# Patient Record
Sex: Female | Born: 1945 | Race: White | Hispanic: No | Marital: Married | Smoking: Never smoker
Health system: Southern US, Community
[De-identification: ages and names within clinical notes are randomized; demographics above are authoritative.]

## PROBLEM LIST (undated history)

## (undated) DIAGNOSIS — E039 Hypothyroidism, unspecified: Secondary | ICD-10-CM

## (undated) HISTORY — PX: TUBAL LIGATION: SHX77

## (undated) HISTORY — PX: FINGER SURGERY: SHX640

---

## 2010-03-01 ENCOUNTER — Ambulatory Visit: Payer: Self-pay | Admitting: Family Medicine

## 2010-07-24 ENCOUNTER — Ambulatory Visit: Payer: Self-pay | Admitting: Family Medicine

## 2011-02-21 ENCOUNTER — Emergency Department: Payer: Self-pay | Admitting: Unknown Physician Specialty

## 2011-02-21 LAB — URINALYSIS, COMPLETE
Bilirubin,UR: NEGATIVE
Blood: NEGATIVE
Glucose,UR: NEGATIVE mg/dL (ref 0–75)
Ketone: NEGATIVE
Leukocyte Esterase: NEGATIVE
Nitrite: NEGATIVE
Ph: 5 (ref 4.5–8.0)
Protein: NEGATIVE
RBC,UR: <1 /HPF (ref 0–5)
Specific Gravity: 1.025 (ref 1.003–1.030)
Squamous Epithelial: 8
WBC UR: <1 /HPF (ref 0–5)

## 2011-06-12 ENCOUNTER — Ambulatory Visit: Payer: Self-pay | Admitting: Internal Medicine

## 2012-06-23 ENCOUNTER — Ambulatory Visit: Payer: Self-pay | Admitting: Internal Medicine

## 2013-07-05 ENCOUNTER — Ambulatory Visit: Payer: Self-pay | Admitting: Internal Medicine

## 2015-02-06 DIAGNOSIS — H40003 Preglaucoma, unspecified, bilateral: Secondary | ICD-10-CM | POA: Diagnosis not present

## 2015-06-08 DIAGNOSIS — E7800 Pure hypercholesterolemia, unspecified: Secondary | ICD-10-CM | POA: Diagnosis not present

## 2015-06-08 DIAGNOSIS — E042 Nontoxic multinodular goiter: Secondary | ICD-10-CM | POA: Diagnosis not present

## 2015-06-15 DIAGNOSIS — Z1231 Encounter for screening mammogram for malignant neoplasm of breast: Secondary | ICD-10-CM | POA: Diagnosis not present

## 2015-06-15 DIAGNOSIS — R232 Flushing: Secondary | ICD-10-CM | POA: Diagnosis not present

## 2015-06-15 DIAGNOSIS — Z0001 Encounter for general adult medical examination with abnormal findings: Secondary | ICD-10-CM | POA: Diagnosis not present

## 2015-06-15 DIAGNOSIS — E042 Nontoxic multinodular goiter: Secondary | ICD-10-CM | POA: Diagnosis not present

## 2015-06-15 DIAGNOSIS — E7800 Pure hypercholesterolemia, unspecified: Secondary | ICD-10-CM | POA: Diagnosis not present

## 2015-06-18 ENCOUNTER — Other Ambulatory Visit: Payer: Self-pay | Admitting: Internal Medicine

## 2015-06-18 DIAGNOSIS — Z1231 Encounter for screening mammogram for malignant neoplasm of breast: Secondary | ICD-10-CM

## 2015-06-26 ENCOUNTER — Other Ambulatory Visit: Payer: Self-pay | Admitting: Internal Medicine

## 2015-06-26 ENCOUNTER — Ambulatory Visit
Admission: RE | Admit: 2015-06-26 | Discharge: 2015-06-26 | Disposition: A | Discharge status: Home / Self Care | Payer: PPO | Source: Ambulatory Visit | Attending: Internal Medicine | Admitting: Internal Medicine

## 2015-06-26 DIAGNOSIS — Z1231 Encounter for screening mammogram for malignant neoplasm of breast: Secondary | ICD-10-CM

## 2015-12-11 DIAGNOSIS — E042 Nontoxic multinodular goiter: Secondary | ICD-10-CM | POA: Diagnosis not present

## 2015-12-18 DIAGNOSIS — Z0000 Encounter for general adult medical examination without abnormal findings: Secondary | ICD-10-CM | POA: Diagnosis not present

## 2015-12-18 DIAGNOSIS — R232 Flushing: Secondary | ICD-10-CM | POA: Diagnosis not present

## 2015-12-18 DIAGNOSIS — E042 Nontoxic multinodular goiter: Secondary | ICD-10-CM | POA: Diagnosis not present

## 2015-12-18 DIAGNOSIS — E7800 Pure hypercholesterolemia, unspecified: Secondary | ICD-10-CM | POA: Diagnosis not present

## 2016-05-29 ENCOUNTER — Other Ambulatory Visit: Payer: Self-pay | Admitting: Internal Medicine

## 2016-05-29 DIAGNOSIS — Z1231 Encounter for screening mammogram for malignant neoplasm of breast: Secondary | ICD-10-CM

## 2016-06-10 DIAGNOSIS — E7800 Pure hypercholesterolemia, unspecified: Secondary | ICD-10-CM | POA: Diagnosis not present

## 2016-06-10 DIAGNOSIS — E042 Nontoxic multinodular goiter: Secondary | ICD-10-CM | POA: Diagnosis not present

## 2016-06-16 DIAGNOSIS — Z0001 Encounter for general adult medical examination with abnormal findings: Secondary | ICD-10-CM | POA: Diagnosis not present

## 2016-06-16 DIAGNOSIS — E7800 Pure hypercholesterolemia, unspecified: Secondary | ICD-10-CM | POA: Diagnosis not present

## 2016-06-16 DIAGNOSIS — Z1211 Encounter for screening for malignant neoplasm of colon: Secondary | ICD-10-CM | POA: Diagnosis not present

## 2016-06-16 DIAGNOSIS — E042 Nontoxic multinodular goiter: Secondary | ICD-10-CM | POA: Diagnosis not present

## 2016-06-19 DIAGNOSIS — H2513 Age-related nuclear cataract, bilateral: Secondary | ICD-10-CM | POA: Diagnosis not present

## 2016-06-24 DIAGNOSIS — Z1211 Encounter for screening for malignant neoplasm of colon: Secondary | ICD-10-CM | POA: Diagnosis not present

## 2016-06-26 ENCOUNTER — Ambulatory Visit
Admission: RE | Admit: 2016-06-26 | Discharge: 2016-06-26 | Disposition: A | Discharge status: Home / Self Care | Payer: PPO | Source: Ambulatory Visit | Attending: Internal Medicine | Admitting: Internal Medicine

## 2016-06-26 DIAGNOSIS — Z1231 Encounter for screening mammogram for malignant neoplasm of breast: Secondary | ICD-10-CM

## 2016-08-22 DIAGNOSIS — H2513 Age-related nuclear cataract, bilateral: Secondary | ICD-10-CM | POA: Diagnosis not present

## 2016-12-10 DIAGNOSIS — E042 Nontoxic multinodular goiter: Secondary | ICD-10-CM | POA: Diagnosis not present

## 2016-12-17 DIAGNOSIS — Z5181 Encounter for therapeutic drug level monitoring: Secondary | ICD-10-CM | POA: Diagnosis not present

## 2016-12-17 DIAGNOSIS — E042 Nontoxic multinodular goiter: Secondary | ICD-10-CM | POA: Diagnosis not present

## 2016-12-17 DIAGNOSIS — R232 Flushing: Secondary | ICD-10-CM | POA: Diagnosis not present

## 2016-12-17 DIAGNOSIS — E7800 Pure hypercholesterolemia, unspecified: Secondary | ICD-10-CM | POA: Diagnosis not present

## 2017-06-09 DIAGNOSIS — E042 Nontoxic multinodular goiter: Secondary | ICD-10-CM | POA: Diagnosis not present

## 2017-06-09 DIAGNOSIS — Z5181 Encounter for therapeutic drug level monitoring: Secondary | ICD-10-CM | POA: Diagnosis not present

## 2017-06-09 DIAGNOSIS — E7800 Pure hypercholesterolemia, unspecified: Secondary | ICD-10-CM | POA: Diagnosis not present

## 2017-06-16 DIAGNOSIS — Z5181 Encounter for therapeutic drug level monitoring: Secondary | ICD-10-CM | POA: Diagnosis not present

## 2017-06-16 DIAGNOSIS — E7800 Pure hypercholesterolemia, unspecified: Secondary | ICD-10-CM | POA: Diagnosis not present

## 2017-06-16 DIAGNOSIS — Z0001 Encounter for general adult medical examination with abnormal findings: Secondary | ICD-10-CM | POA: Diagnosis not present

## 2017-06-16 DIAGNOSIS — E042 Nontoxic multinodular goiter: Secondary | ICD-10-CM | POA: Diagnosis not present

## 2017-06-16 DIAGNOSIS — Z0000 Encounter for general adult medical examination without abnormal findings: Secondary | ICD-10-CM | POA: Diagnosis not present

## 2017-07-10 DIAGNOSIS — H2513 Age-related nuclear cataract, bilateral: Secondary | ICD-10-CM | POA: Diagnosis not present

## 2017-07-10 DIAGNOSIS — H35371 Puckering of macula, right eye: Secondary | ICD-10-CM | POA: Diagnosis not present

## 2017-09-04 DIAGNOSIS — H2511 Age-related nuclear cataract, right eye: Secondary | ICD-10-CM | POA: Diagnosis not present

## 2017-09-08 ENCOUNTER — Encounter: Payer: Self-pay | Admitting: *Deleted

## 2017-09-22 ENCOUNTER — Ambulatory Visit: Payer: PPO | Admitting: Anesthesiology

## 2017-09-22 ENCOUNTER — Encounter
Admission: RE | Disposition: A | Discharge status: Home / Self Care | Payer: Self-pay | Source: Ambulatory Visit | Attending: Ophthalmology

## 2017-09-22 ENCOUNTER — Encounter: Payer: Self-pay | Admitting: Emergency Medicine

## 2017-09-22 ENCOUNTER — Ambulatory Visit
Admission: RE | Admit: 2017-09-22 | Discharge: 2017-09-22 | Disposition: A | Discharge status: Home / Self Care | Payer: PPO | Source: Ambulatory Visit | Attending: Ophthalmology | Admitting: Ophthalmology

## 2017-09-22 ENCOUNTER — Other Ambulatory Visit: Payer: Self-pay

## 2017-09-22 DIAGNOSIS — H2511 Age-related nuclear cataract, right eye: Secondary | ICD-10-CM | POA: Diagnosis not present

## 2017-09-22 DIAGNOSIS — E039 Hypothyroidism, unspecified: Secondary | ICD-10-CM | POA: Diagnosis not present

## 2017-09-22 DIAGNOSIS — Z79890 Hormone replacement therapy (postmenopausal): Secondary | ICD-10-CM | POA: Diagnosis not present

## 2017-09-22 DIAGNOSIS — E7800 Pure hypercholesterolemia, unspecified: Secondary | ICD-10-CM | POA: Diagnosis not present

## 2017-09-22 DIAGNOSIS — Z79899 Other long term (current) drug therapy: Secondary | ICD-10-CM | POA: Insufficient documentation

## 2017-09-22 HISTORY — PX: CATARACT EXTRACTION W/PHACO: SHX586

## 2017-09-22 HISTORY — DX: Hypothyroidism, unspecified: E03.9

## 2017-09-22 SURGERY — PHACOEMULSIFICATION, CATARACT, WITH IOL INSERTION
Anesthesia: Monitor Anesthesia Care | Site: Eye | Laterality: Right | Wound class: Clean

## 2017-09-22 MED ORDER — ARMC OPHTHALMIC DILATING DROPS
OPHTHALMIC | Status: AC
  Administered 2017-09-22: 1 via OPHTHALMIC
  Filled 2017-09-22: qty 0.5

## 2017-09-22 MED ORDER — EPINEPHRINE PF 1 MG/ML IJ SOLN
INTRAOCULAR | Status: DC | PRN
  Administered 2017-09-22: 09:00:00 via OPHTHALMIC

## 2017-09-22 MED ORDER — MIDAZOLAM HCL 2 MG/2ML IJ SOLN
INTRAMUSCULAR | Status: DC | PRN
  Administered 2017-09-22: 2 mg via INTRAVENOUS

## 2017-09-22 MED ORDER — LIDOCAINE HCL (PF) 4 % IJ SOLN
INTRAMUSCULAR | Status: AC
  Filled 2017-09-22: qty 5

## 2017-09-22 MED ORDER — SODIUM CHLORIDE 0.9 % IV SOLN
INTRAVENOUS | Status: DC
  Administered 2017-09-22 (×2): via INTRAVENOUS

## 2017-09-22 MED ORDER — EPINEPHRINE PF 1 MG/ML IJ SOLN
INTRAMUSCULAR | Status: AC
  Filled 2017-09-22: qty 2

## 2017-09-22 MED ORDER — POVIDONE-IODINE 5 % OP SOLN
OPHTHALMIC | Status: DC | PRN
  Administered 2017-09-22: 1 via OPHTHALMIC

## 2017-09-22 MED ORDER — NA CHONDROIT SULF-NA HYALURON 40-17 MG/ML IO SOLN
INTRAOCULAR | Status: DC | PRN
  Administered 2017-09-22: 1 mL via INTRAOCULAR

## 2017-09-22 MED ORDER — CARBACHOL 0.01 % IO SOLN
INTRAOCULAR | Status: DC | PRN
  Administered 2017-09-22: 0.5 mL via INTRAOCULAR

## 2017-09-22 MED ORDER — MOXIFLOXACIN HCL 0.5 % OP SOLN: 1.0000 [drp] | OPHTHALMIC | Status: DC | PRN

## 2017-09-22 MED ORDER — ARMC OPHTHALMIC DILATING DROPS
1.0000 "application " | OPHTHALMIC | Status: AC
  Administered 2017-09-22 (×3): 1 via OPHTHALMIC

## 2017-09-22 MED ORDER — MOXIFLOXACIN HCL 0.5 % OP SOLN
OPHTHALMIC | Status: DC | PRN
  Administered 2017-09-22: 0.2 mL via OPHTHALMIC

## 2017-09-22 MED ORDER — POVIDONE-IODINE 5 % OP SOLN
OPHTHALMIC | Status: AC
  Filled 2017-09-22: qty 30

## 2017-09-22 MED ORDER — MIDAZOLAM HCL 2 MG/2ML IJ SOLN
INTRAMUSCULAR | Status: AC
  Filled 2017-09-22: qty 2

## 2017-09-22 MED ORDER — NA CHONDROIT SULF-NA HYALURON 40-17 MG/ML IO SOLN
INTRAOCULAR | Status: AC
  Filled 2017-09-22: qty 1

## 2017-09-22 MED ORDER — MOXIFLOXACIN HCL 0.5 % OP SOLN
OPHTHALMIC | Status: AC
  Filled 2017-09-22: qty 3

## 2017-09-22 MED ORDER — LIDOCAINE HCL (PF) 4 % IJ SOLN
INTRAOCULAR | Status: DC | PRN
  Administered 2017-09-22: 4 mL via OPHTHALMIC

## 2017-09-22 SURGICAL SUPPLY — 16 items
GLOVE BIO SURGEON STRL SZ8 (GLOVE) ×2 IMPLANT
GLOVE BIOGEL M 6.5 STRL (GLOVE) ×2 IMPLANT
GLOVE SURG LX 8.0 MICRO (GLOVE) ×1
GLOVE SURG LX STRL 8.0 MICRO (GLOVE) ×1 IMPLANT
GOWN STRL REUS W/ TWL LRG LVL3 (GOWN DISPOSABLE) ×2 IMPLANT
GOWN STRL REUS W/TWL LRG LVL3 (GOWN DISPOSABLE) ×2
LABEL CATARACT MEDS ST (LABEL) ×2 IMPLANT
LENS IOL TECNIS ITEC 21.0 (Intraocular Lens) ×2 IMPLANT
PACK CATARACT (MISCELLANEOUS) ×2 IMPLANT
PACK CATARACT BRASINGTON LX (MISCELLANEOUS) ×2 IMPLANT
PACK EYE AFTER SURG (MISCELLANEOUS) ×2 IMPLANT
SOL BSS BAG (MISCELLANEOUS) ×2
SOLUTION BSS BAG (MISCELLANEOUS) ×1 IMPLANT
SYR 5ML LL (SYRINGE) ×2 IMPLANT
WATER STERILE IRR 250ML POUR (IV SOLUTION) ×2 IMPLANT
WIPE NON LINTING 3.25X3.25 (MISCELLANEOUS) ×2 IMPLANT

## 2017-09-22 NOTE — H&P (Signed)
All labs reviewed. Abnormal studies sent to patients PCP when indicated.  Previous H&P reviewed, patient examined, there are NO CHANGES.  Rachael Torres Porfilio8/20/20198:57 AM

## 2017-09-22 NOTE — Anesthesia Preprocedure Evaluation (Addendum)
Anesthesia Evaluation  Patient identified by MRN, date of birth, ID band Patient awake    Reviewed: Allergy & Precautions, H&P , NPO status , Patient's Chart, lab work & pertinent test results  History of Anesthesia Complications Negative for: history of anesthetic complications  Airway Mallampati: II  TM Distance: >3 FB Neck ROM: full    Dental  (+) Teeth Intact   Pulmonary neg pulmonary ROS, neg shortness of breath, neg COPD,    breath sounds clear to auscultation       Cardiovascular (-) angina(-) Past MI and (-) Cardiac Stents negative cardio ROS   Rhythm:regular Rate:Normal     Neuro/Psych negative neurological ROS  negative psych ROS   GI/Hepatic negative GI ROS, Neg liver ROS,   Endo/Other  negative endocrine ROSHypothyroidism   Renal/GU      Musculoskeletal   Abdominal   Peds  Hematology negative hematology ROS (+)   Anesthesia Other Findings Past Medical History: No date: Hypothyroidism  Past Surgical History: No date: FINGER SURGERY No date: TUBAL LIGATION  BMI    Body Mass Index:  29.05 kg/m      Reproductive/Obstetrics negative OB ROS                            Anesthesia Physical Anesthesia Plan  ASA: I  Anesthesia Plan: MAC   Post-op Pain Management:    Induction:   PONV Risk Score and Plan:   Airway Management Planned:   Additional Equipment:   Intra-op Plan:   Post-operative Plan:   Informed Consent: I have reviewed the patients History and Physical, chart, labs and discussed the procedure including the risks, benefits and alternatives for the proposed anesthesia with the patient or authorized representative who has indicated his/her understanding and acceptance.     Plan Discussed with: Anesthesiologist, CRNA and Surgeon  Anesthesia Plan Comments:        Anesthesia Quick Evaluation

## 2017-09-22 NOTE — Op Note (Signed)
PREOPERATIVE DIAGNOSIS:  Nuclear sclerotic cataract of the right eye.   POSTOPERATIVE DIAGNOSIS:  NUCLEAR SCLEROTIC CATARACT RIGHT EYE   OPERATIVE PROCEDURE: Procedure(s): CATARACT EXTRACTION PHACO AND INTRAOCULAR LENS PLACEMENT (IOC)   SURGEON:  Galen ManilaWilliam Steven Basso, MD.   ANESTHESIA:  Anesthesiologist: Jovita GammaFitzgerald, Kathryn L, MD CRNA: Camille BalSandstrom, Tony O, CRNA  1.      Managed anesthesia care. 2.      0.491ml of Shugarcaine was instilled in the eye following the paracentesis.   COMPLICATIONS:  None.   TECHNIQUE:   Stop and chop   DESCRIPTION OF PROCEDURE:  The patient was examined and consented in the preoperative holding area where the aforementioned topical anesthesia was applied to the right eye and then brought back to the Operating Room where the right eye was prepped and draped in the usual sterile ophthalmic fashion and a lid speculum was placed. A paracentesis was created with the side port blade and the anterior chamber was filled with viscoelastic. A near clear corneal incision was performed with the steel keratome. A continuous curvilinear capsulorrhexis was performed with a cystotome followed by the capsulorrhexis forceps. Hydrodissection and hydrodelineation were carried out with BSS on a blunt cannula. The lens was removed in a stop and chop  technique and the remaining cortical material was removed with the irrigation-aspiration handpiece. The capsular bag was inflated with viscoelastic and the Technis ZCB00  lens was placed in the capsular bag without complication. The remaining viscoelastic was removed from the eye with the irrigation-aspiration handpiece. The wounds were hydrated. The anterior chamber was flushed with Miostat and the eye was inflated to physiologic pressure. 0.321ml of Vigamox was placed in the anterior chamber. The wounds were found to be water tight. The eye was dressed with Vigamox. The patient was given protective glasses to wear throughout the day and a shield with  which to sleep tonight. The patient was also given drops with which to begin a drop regimen today and will follow-up with me in one day. Implant Name Type Inv. Item Serial No. Manufacturer Lot No. LRB No. Used  LENS IOL DIOP 21.0 - [id number] Intraocular Lens LENS IOL DIOP 21.0 [id number] AMO 21.0 D Right 1   Procedure(s) with comments: CATARACT EXTRACTION PHACO AND INTRAOCULAR LENS PLACEMENT (IOC) (Right) - US 00:29 AP% 14.9 CDE 4.39 Fluid Pack Lot # [id number]  Electronically signed: Galen ManilaWilliam Kuzey Ogata 09/22/2017 9:19 AM

## 2017-09-22 NOTE — Discharge Instructions (Signed)
Eye Surgery Discharge Instructions    Expect mild scratchy sensation or mild soreness. DO NOT RUB YOUR EYE!  The day of surgery:  Minimal physical activity, but bed rest is not required  No reading, computer work, or close hand work  No bending, lifting, or straining.  May watch TV  For 24 hours:  No driving, legal decisions, or alcoholic beverages  Safety precautions  Eat anything you prefer: It is better to start with liquids, then soup then solid foods.  _____ Eye patch should be worn until postoperative exam tomorrow.  ____ Solar shield eyeglasses should be worn for comfort in the sunlight/patch while sleeping  Resume all regular medications including aspirin or Coumadin if these were discontinued prior to surgery. You may shower, bathe, shave, or wash your hair. Tylenol may be taken for mild discomfort.  Call your doctor if you experience significant pain, nausea, or vomiting, fever > 101 or other signs of infection. [phone] or [phone] Specific instructions:  Follow-up Information    Galen ManilaPorfilio, William, MD Follow up.   Specialty:  Ophthalmology Why:  August 21 at 10:10am Contact information: [address] [place] [id number] [phone]

## 2017-09-22 NOTE — Anesthesia Postprocedure Evaluation (Signed)
Anesthesia Post Note  Patient: Rachael Torres  Procedure(s) Performed: CATARACT EXTRACTION PHACO AND INTRAOCULAR LENS PLACEMENT (IOC) (Right Eye)  Patient location during evaluation: PACU Anesthesia Type: MAC Level of consciousness: awake and alert Pain management: pain level controlled Vital Signs Assessment: post-procedure vital signs reviewed and stable Respiratory status: spontaneous breathing, nonlabored ventilation and respiratory function stable Cardiovascular status: stable and blood pressure returned to baseline Postop Assessment: no apparent nausea or vomiting Anesthetic complications: no     Last Vitals:  Vitals:   09/22/17 0923 09/22/17 0930  BP: (!) 149/76 131/80  Pulse: 66 64  Resp: 16 16  Temp: 36.7 C   SpO2: 98% 99%    Last Pain:  Vitals:   09/22/17 0930  TempSrc:   PainSc: 0-No pain                 Durenda Hurt

## 2017-09-22 NOTE — Anesthesia Post-op Follow-up Note (Signed)
Anesthesia QCDR form completed.        

## 2017-09-22 NOTE — Transfer of Care (Signed)
Immediate Anesthesia Transfer of Care Note  Patient: Rachael Torres  Procedure(s) Performed: CATARACT EXTRACTION PHACO AND INTRAOCULAR LENS PLACEMENT (IOC) (Right Eye)  Patient Location: PACU  Anesthesia Type:MAC  Level of Consciousness: awake  Airway & Oxygen Therapy: Patient Spontanous Breathing  Post-op Assessment: Report given to RN  Post vital signs: stable  Last Vitals:  Vitals Value Taken Time  BP    Temp    Pulse    Resp    SpO2      Last Pain:  Vitals:   09/22/17 0728  TempSrc: Temporal  PainSc: 0-No pain         Complications: No apparent anesthesia complications

## 2017-10-01 DIAGNOSIS — H2512 Age-related nuclear cataract, left eye: Secondary | ICD-10-CM | POA: Diagnosis not present

## 2017-10-08 ENCOUNTER — Encounter: Payer: Self-pay | Admitting: *Deleted

## 2017-10-13 ENCOUNTER — Encounter
Admission: RE | Disposition: A | Discharge status: Home / Self Care | Payer: Self-pay | Source: Ambulatory Visit | Attending: Ophthalmology

## 2017-10-13 ENCOUNTER — Ambulatory Visit: Payer: PPO | Admitting: Anesthesiology

## 2017-10-13 ENCOUNTER — Other Ambulatory Visit: Payer: Self-pay

## 2017-10-13 ENCOUNTER — Encounter: Payer: Self-pay | Admitting: Anesthesiology

## 2017-10-13 ENCOUNTER — Ambulatory Visit
Admission: RE | Admit: 2017-10-13 | Discharge: 2017-10-13 | Disposition: A | Discharge status: Home / Self Care | Payer: PPO | Source: Ambulatory Visit | Attending: Ophthalmology | Admitting: Ophthalmology

## 2017-10-13 DIAGNOSIS — E7800 Pure hypercholesterolemia, unspecified: Secondary | ICD-10-CM | POA: Insufficient documentation

## 2017-10-13 DIAGNOSIS — H2512 Age-related nuclear cataract, left eye: Secondary | ICD-10-CM | POA: Diagnosis not present

## 2017-10-13 DIAGNOSIS — Z79890 Hormone replacement therapy (postmenopausal): Secondary | ICD-10-CM | POA: Diagnosis not present

## 2017-10-13 DIAGNOSIS — E039 Hypothyroidism, unspecified: Secondary | ICD-10-CM | POA: Diagnosis not present

## 2017-10-13 DIAGNOSIS — Z79899 Other long term (current) drug therapy: Secondary | ICD-10-CM | POA: Insufficient documentation

## 2017-10-13 HISTORY — PX: CATARACT EXTRACTION W/PHACO: SHX586

## 2017-10-13 SURGERY — PHACOEMULSIFICATION, CATARACT, WITH IOL INSERTION
Anesthesia: Monitor Anesthesia Care | Site: Eye | Laterality: Left | Wound class: "Clean "

## 2017-10-13 MED ORDER — NA CHONDROIT SULF-NA HYALURON 40-17 MG/ML IO SOLN
INTRAOCULAR | Status: DC | PRN
  Administered 2017-10-13: 1 mL via INTRAOCULAR

## 2017-10-13 MED ORDER — MOXIFLOXACIN HCL 0.5 % OP SOLN
OPHTHALMIC | Status: DC | PRN
  Administered 2017-10-13: .2 mL via OPHTHALMIC

## 2017-10-13 MED ORDER — MOXIFLOXACIN HCL 0.5 % OP SOLN
OPHTHALMIC | Status: AC
  Filled 2017-10-13: qty 3

## 2017-10-13 MED ORDER — MIDAZOLAM HCL 2 MG/2ML IJ SOLN
INTRAMUSCULAR | Status: DC | PRN
  Administered 2017-10-13: 0.5 mg via INTRAVENOUS
  Administered 2017-10-13: 1 mg via INTRAVENOUS

## 2017-10-13 MED ORDER — EPINEPHRINE PF 1 MG/ML IJ SOLN
INTRAMUSCULAR | Status: AC
  Filled 2017-10-13: qty 1

## 2017-10-13 MED ORDER — CARBACHOL 0.01 % IO SOLN
INTRAOCULAR | Status: DC | PRN
  Administered 2017-10-13: .5 mL via INTRAOCULAR

## 2017-10-13 MED ORDER — LIDOCAINE HCL (PF) 4 % IJ SOLN
INTRAOCULAR | Status: DC | PRN
  Administered 2017-10-13: 2 mL via OPHTHALMIC

## 2017-10-13 MED ORDER — LIDOCAINE HCL (PF) 4 % IJ SOLN
INTRAMUSCULAR | Status: AC
  Filled 2017-10-13: qty 5

## 2017-10-13 MED ORDER — POVIDONE-IODINE 5 % OP SOLN
OPHTHALMIC | Status: AC
  Filled 2017-10-13: qty 30

## 2017-10-13 MED ORDER — MIDAZOLAM HCL 2 MG/2ML IJ SOLN
INTRAMUSCULAR | Status: AC
  Filled 2017-10-13: qty 2

## 2017-10-13 MED ORDER — POVIDONE-IODINE 5 % OP SOLN
OPHTHALMIC | Status: DC | PRN
  Administered 2017-10-13: 1 via OPHTHALMIC

## 2017-10-13 MED ORDER — EPINEPHRINE PF 1 MG/ML IJ SOLN
INTRAOCULAR | Status: DC | PRN
  Administered 2017-10-13: 1 mL via OPHTHALMIC

## 2017-10-13 MED ORDER — ARMC OPHTHALMIC DILATING DROPS
OPHTHALMIC | Status: AC
  Administered 2017-10-13: 1 via OPHTHALMIC
  Filled 2017-10-13: qty 0.5

## 2017-10-13 MED ORDER — MOXIFLOXACIN HCL 0.5 % OP SOLN: 1.0000 [drp] | OPHTHALMIC | Status: DC | PRN

## 2017-10-13 MED ORDER — ARMC OPHTHALMIC DILATING DROPS
1.0000 "application " | OPHTHALMIC | Status: AC
  Administered 2017-10-13 (×3): 1 via OPHTHALMIC

## 2017-10-13 MED ORDER — NA CHONDROIT SULF-NA HYALURON 40-17 MG/ML IO SOLN
INTRAOCULAR | Status: AC
  Filled 2017-10-13: qty 1

## 2017-10-13 MED ORDER — SODIUM CHLORIDE 0.9 % IV SOLN
INTRAVENOUS | Status: DC
  Administered 2017-10-13: 08:00:00 via INTRAVENOUS

## 2017-10-13 SURGICAL SUPPLY — 16 items
GLOVE BIO SURGEON STRL SZ8 (GLOVE) ×2 IMPLANT
GLOVE BIOGEL M 6.5 STRL (GLOVE) ×2 IMPLANT
GLOVE SURG LX 8.0 MICRO (GLOVE) ×1
GLOVE SURG LX STRL 8.0 MICRO (GLOVE) ×1 IMPLANT
GOWN STRL REUS W/ TWL LRG LVL3 (GOWN DISPOSABLE) ×2 IMPLANT
GOWN STRL REUS W/TWL LRG LVL3 (GOWN DISPOSABLE) ×2
LABEL CATARACT MEDS ST (LABEL) ×2 IMPLANT
LENS IOL TECNIS ITEC 20.5 (Intraocular Lens) ×1 IMPLANT
PACK CATARACT (MISCELLANEOUS) ×2 IMPLANT
PACK CATARACT BRASINGTON LX (MISCELLANEOUS) ×2 IMPLANT
PACK EYE AFTER SURG (MISCELLANEOUS) ×2 IMPLANT
SOL BSS BAG (MISCELLANEOUS) ×2
SOLUTION BSS BAG (MISCELLANEOUS) ×1 IMPLANT
SYR 5ML LL (SYRINGE) ×2 IMPLANT
WATER STERILE IRR 250ML POUR (IV SOLUTION) ×2 IMPLANT
WIPE NON LINTING 3.25X3.25 (MISCELLANEOUS) ×2 IMPLANT

## 2017-10-13 NOTE — Anesthesia Post-op Follow-up Note (Signed)
Anesthesia QCDR form completed.        

## 2017-10-13 NOTE — Anesthesia Postprocedure Evaluation (Signed)
Anesthesia Post Note  Patient: Rachael Torres  Procedure(s) Performed: CATARACT EXTRACTION PHACO AND INTRAOCULAR LENS PLACEMENT (IOC) (Left Eye)  Patient location during evaluation: Short Stay Anesthesia Type: MAC Level of consciousness: awake and alert, oriented and patient cooperative Pain management: satisfactory to patient Vital Signs Assessment: post-procedure vital signs reviewed and stable Respiratory status: spontaneous breathing, respiratory function stable and nonlabored ventilation Cardiovascular status: blood pressure returned to baseline and stable Postop Assessment: no headache and no apparent nausea or vomiting Anesthetic complications: no     Last Vitals:  Vitals:   10/13/17 0803 10/13/17 0925  BP: 128/87 139/77  Pulse: 84 66  Resp: 17 16  Temp: 36.8 C 36.7 C  SpO2: 97% 98%    Last Pain:  Vitals:   10/13/17 0925  TempSrc: Temporal  PainSc: 0-No pain                 Eben Burow

## 2017-10-13 NOTE — Op Note (Signed)
PREOPERATIVE DIAGNOSIS:  Nuclear sclerotic cataract of the left eye.   POSTOPERATIVE DIAGNOSIS:  Nuclear sclerotic cataract of the left eye.   OPERATIVE PROCEDURE: Procedure(s): CATARACT EXTRACTION PHACO AND INTRAOCULAR LENS PLACEMENT (IOC)   SURGEON:  Galen Manila, MD.   ANESTHESIA:  Anesthesiologist: Berdine Addison, MD CRNA: Dava Najjar, CRNA  1.      Managed anesthesia care. 2.     0.23ml of Shugarcaine was instilled following the paracentesis   COMPLICATIONS:  None.   TECHNIQUE:   Stop and chop   DESCRIPTION OF PROCEDURE:  The patient was examined and consented in the preoperative holding area where the aforementioned topical anesthesia was applied to the left eye and then brought back to the Operating Room where the left eye was prepped and draped in the usual sterile ophthalmic fashion and a lid speculum was placed. A paracentesis was created with the side port blade and the anterior chamber was filled with viscoelastic. A near clear corneal incision was performed with the steel keratome. A continuous curvilinear capsulorrhexis was performed with a cystotome followed by the capsulorrhexis forceps. Hydrodissection and hydrodelineation were carried out with BSS on a blunt cannula. The lens was removed in a stop and chop  technique and the remaining cortical material was removed with the irrigation-aspiration handpiece. The capsular bag was inflated with viscoelastic and the Technis ZCB00 lens was placed in the capsular bag without complication. The remaining viscoelastic was removed from the eye with the irrigation-aspiration handpiece. The wounds were hydrated. The anterior chamber was flushed with Miostat and the eye was inflated to physiologic pressure. 0.67ml Vigamox was placed in the anterior chamber. The wounds were found to be water tight. The eye was dressed with Vigamox. The patient was given protective glasses to wear throughout the day and a shield with which to sleep  tonight. The patient was also given drops with which to begin a drop regimen today and will follow-up with me in one day. Implant Name Type Inv. Item Serial No. Manufacturer Lot No. LRB No. Used  LENS IOL DIOP 20.5 - [id number] Intraocular Lens LENS IOL DIOP 20.5 [id number] AMO  Left 1    Procedure(s) with comments: CATARACT EXTRACTION PHACO AND INTRAOCULAR LENS PLACEMENT (IOC) (Left) - [place] 00:20 AP% 12.0 CDE 2.45 Fluid pack lot # [id number]  Electronically signed: Galen Manila 10/13/2017 9:23 AM

## 2017-10-13 NOTE — Transfer of Care (Signed)
Immediate Anesthesia Transfer of Care Note  Patient: Rachael Torres  Procedure(s) Performed: CATARACT EXTRACTION PHACO AND INTRAOCULAR LENS PLACEMENT (IOC) (Left Eye)  Patient Location: Short Stay  Anesthesia Type:MAC  Level of Consciousness: awake, alert , oriented and patient cooperative  Airway & Oxygen Therapy: Patient Spontanous Breathing  Post-op Assessment: Report given to RN and Post -op Vital signs reviewed and stable  Post vital signs: Reviewed and stable  Last Vitals:  Vitals Value Taken Time  BP    Temp    Pulse    Resp    SpO2      Last Pain:  Vitals:   10/13/17 0803  TempSrc: Oral  PainSc: 0-No pain         Complications: No apparent anesthesia complications

## 2017-10-13 NOTE — H&P (Signed)
All labs reviewed. Abnormal studies sent to patients PCP when indicated.  Previous H&P reviewed, patient examined, there are NO CHANGES.  Rachael Goldsborough Porfilio9/10/20199:00 AM

## 2017-10-13 NOTE — Anesthesia Preprocedure Evaluation (Signed)
Anesthesia Evaluation  Patient identified by MRN, date of birth, ID band Patient awake    Reviewed: Allergy & Precautions, NPO status , Patient's Chart, lab work & pertinent test results, reviewed documented beta blocker date and time   Airway Mallampati: III  TM Distance: >3 FB     Dental  (+) Chipped   Pulmonary           Cardiovascular      Neuro/Psych    GI/Hepatic   Endo/Other  Hypothyroidism   Renal/GU      Musculoskeletal   Abdominal   Peds  Hematology   Anesthesia Other Findings Obese.  Reproductive/Obstetrics                             Anesthesia Physical Anesthesia Plan  ASA: II  Anesthesia Plan: MAC   Post-op Pain Management:    Induction:   PONV Risk Score and Plan:   Airway Management Planned:   Additional Equipment:   Intra-op Plan:   Post-operative Plan:   Informed Consent: I have reviewed the patients History and Physical, chart, labs and discussed the procedure including the risks, benefits and alternatives for the proposed anesthesia with the patient or authorized representative who has indicated his/her understanding and acceptance.     Plan Discussed with: CRNA  Anesthesia Plan Comments:         Anesthesia Quick Evaluation

## 2017-10-13 NOTE — Discharge Instructions (Addendum)
Eye Surgery Discharge Instructions  Expect mild scratchy sensation or mild soreness. DO NOT RUB YOUR EYE!  The day of surgery:  Minimal physical activity, but bed rest is not required  No reading, computer work, or close hand work  No bending, lifting, or straining.  May watch TV  For 24 hours:  No driving, legal decisions, or alcoholic beverages  Safety precautions  Eat anything you prefer: It is better to start with liquids, then soup then solid foods.  Solar shield eyeglasses should be worn for comfort in the sunlight/patch while sleeping  Resume all regular medications including aspirin or Coumadin if these were discontinued prior to surgery. You may shower, bathe, shave, or wash your hair. Tylenol may be taken for mild discomfort.\ FOLLOW DR. PORFILIO'S EYE DROP INSTRUCTION SHEET AS REVIEWED.  Call your doctor if you experience significant pain, nausea, or vomiting, fever > 101 or other signs of infection. [phone] or [phone] Specific instructions:  Follow-up Information    Galen Manila, MD Follow up.   Specialty:  Ophthalmology Why:  10/14/17 @ 10:40 am Contact information: [address] [place] [address] [phone]

## 2017-10-14 ENCOUNTER — Encounter: Payer: Self-pay | Admitting: Ophthalmology

## 2017-11-23 ENCOUNTER — Other Ambulatory Visit: Payer: Self-pay | Admitting: Internal Medicine

## 2017-11-23 DIAGNOSIS — Z1231 Encounter for screening mammogram for malignant neoplasm of breast: Secondary | ICD-10-CM

## 2017-11-26 DIAGNOSIS — Z961 Presence of intraocular lens: Secondary | ICD-10-CM | POA: Diagnosis not present

## 2017-12-10 DIAGNOSIS — Z5181 Encounter for therapeutic drug level monitoring: Secondary | ICD-10-CM | POA: Diagnosis not present

## 2017-12-16 ENCOUNTER — Ambulatory Visit
Admission: RE | Admit: 2017-12-16 | Discharge: 2017-12-16 | Disposition: A | Discharge status: Home / Self Care | Payer: PPO | Source: Ambulatory Visit | Attending: Internal Medicine | Admitting: Internal Medicine

## 2017-12-16 DIAGNOSIS — Z1231 Encounter for screening mammogram for malignant neoplasm of breast: Secondary | ICD-10-CM | POA: Diagnosis not present

## 2017-12-17 DIAGNOSIS — E039 Hypothyroidism, unspecified: Secondary | ICD-10-CM | POA: Diagnosis not present

## 2017-12-17 DIAGNOSIS — R232 Flushing: Secondary | ICD-10-CM | POA: Diagnosis not present

## 2017-12-17 DIAGNOSIS — Z5181 Encounter for therapeutic drug level monitoring: Secondary | ICD-10-CM | POA: Diagnosis not present

## 2017-12-17 DIAGNOSIS — E7800 Pure hypercholesterolemia, unspecified: Secondary | ICD-10-CM | POA: Diagnosis not present

## 2018-08-10 DIAGNOSIS — Z0001 Encounter for general adult medical examination with abnormal findings: Secondary | ICD-10-CM | POA: Diagnosis not present

## 2018-08-10 DIAGNOSIS — E042 Nontoxic multinodular goiter: Secondary | ICD-10-CM | POA: Diagnosis not present

## 2018-08-10 DIAGNOSIS — E039 Hypothyroidism, unspecified: Secondary | ICD-10-CM | POA: Diagnosis not present

## 2018-08-10 DIAGNOSIS — E7800 Pure hypercholesterolemia, unspecified: Secondary | ICD-10-CM | POA: Diagnosis not present

## 2018-08-10 DIAGNOSIS — Z0000 Encounter for general adult medical examination without abnormal findings: Secondary | ICD-10-CM | POA: Diagnosis not present

## 2018-11-09 ENCOUNTER — Other Ambulatory Visit: Payer: Self-pay | Admitting: Internal Medicine

## 2018-11-09 DIAGNOSIS — Z1231 Encounter for screening mammogram for malignant neoplasm of breast: Secondary | ICD-10-CM

## 2018-12-21 ENCOUNTER — Ambulatory Visit
Admission: RE | Admit: 2018-12-21 | Discharge: 2018-12-21 | Disposition: A | Discharge status: Home / Self Care | Payer: PPO | Source: Ambulatory Visit | Attending: Internal Medicine | Admitting: Internal Medicine

## 2018-12-21 DIAGNOSIS — Z1231 Encounter for screening mammogram for malignant neoplasm of breast: Secondary | ICD-10-CM | POA: Diagnosis not present

## 2019-03-20 ENCOUNTER — Ambulatory Visit: Payer: PPO | Attending: Internal Medicine

## 2019-03-20 DIAGNOSIS — Z23 Encounter for immunization: Secondary | ICD-10-CM | POA: Insufficient documentation

## 2019-03-20 NOTE — Progress Notes (Signed)
   Covid-19 Vaccination Clinic  Name:  Rachael Torres    MRN: [id number] DOB: 1945-03-29  03/20/2019  Rachael Torres was observed post Covid-19 immunization for 15 minutes without incidence. She was provided with Vaccine Information Sheet and instruction to access the V-Safe system.   Rachael Torres was instructed to call 911 with any severe reactions post vaccine: Marland Kitchen Difficulty breathing  . Swelling of your face and throat  . A fast heartbeat  . A bad rash all over your body  . Dizziness and weakness    Immunizations Administered    Name Date Dose VIS Date Route   Pfizer COVID-19 Vaccine 03/20/2019  9:24 AM 0.3 mL 01/14/2019 Intramuscular   Manufacturer: [employer], [employer]   Lot: [id number]   NDC: [address]-0

## 2019-04-13 ENCOUNTER — Ambulatory Visit: Payer: PPO | Attending: Internal Medicine

## 2019-04-13 DIAGNOSIS — Z23 Encounter for immunization: Secondary | ICD-10-CM

## 2019-04-13 NOTE — Progress Notes (Signed)
   Covid-19 Vaccination Clinic  Name:  Rachael Torres    MRN: [id number] DOB: [birth year]  04/13/2019  [name] was observed post Covid-19 immunization for 15 minutes without incident. She was provided with Vaccine Information Sheet and instruction to access the V-Safe system.   [name] was instructed to call 911 with any severe reactions post vaccine: Marland Kitchen Difficulty breathing  . Swelling of face and throat  . A fast heartbeat  . A bad rash all over body  . Dizziness and weakness   Immunizations Administered    Name Date Dose VIS Date Route   Pfizer COVID-19 Vaccine 04/13/2019  1:42 PM 0.3 mL 01/14/2019 Intramuscular   Manufacturer: [employer], [employer]   Lot: [id number]   NDC: [address]-5

## 2019-05-04 DIAGNOSIS — R232 Flushing: Secondary | ICD-10-CM | POA: Diagnosis not present

## 2019-05-04 DIAGNOSIS — E7800 Pure hypercholesterolemia, unspecified: Secondary | ICD-10-CM | POA: Diagnosis not present

## 2019-05-04 DIAGNOSIS — E039 Hypothyroidism, unspecified: Secondary | ICD-10-CM | POA: Diagnosis not present

## 2019-05-04 DIAGNOSIS — E042 Nontoxic multinodular goiter: Secondary | ICD-10-CM | POA: Diagnosis not present

## 2019-11-01 DIAGNOSIS — E7800 Pure hypercholesterolemia, unspecified: Secondary | ICD-10-CM | POA: Diagnosis not present

## 2019-11-01 DIAGNOSIS — E039 Hypothyroidism, unspecified: Secondary | ICD-10-CM | POA: Diagnosis not present

## 2019-12-05 DIAGNOSIS — M25562 Pain in left knee: Secondary | ICD-10-CM | POA: Diagnosis not present

## 2019-12-27 DIAGNOSIS — R399 Unspecified symptoms and signs involving the genitourinary system: Secondary | ICD-10-CM | POA: Diagnosis not present

## 2020-01-10 DIAGNOSIS — E042 Nontoxic multinodular goiter: Secondary | ICD-10-CM | POA: Diagnosis not present

## 2020-01-10 DIAGNOSIS — M25562 Pain in left knee: Secondary | ICD-10-CM | POA: Diagnosis not present

## 2020-01-10 DIAGNOSIS — E039 Hypothyroidism, unspecified: Secondary | ICD-10-CM | POA: Diagnosis not present

## 2020-01-10 DIAGNOSIS — Z0001 Encounter for general adult medical examination with abnormal findings: Secondary | ICD-10-CM | POA: Diagnosis not present

## 2020-01-10 DIAGNOSIS — Z0000 Encounter for general adult medical examination without abnormal findings: Secondary | ICD-10-CM | POA: Diagnosis not present

## 2020-01-10 DIAGNOSIS — Z1211 Encounter for screening for malignant neoplasm of colon: Secondary | ICD-10-CM | POA: Diagnosis not present

## 2020-01-10 DIAGNOSIS — E7800 Pure hypercholesterolemia, unspecified: Secondary | ICD-10-CM | POA: Diagnosis not present

## 2020-01-13 DIAGNOSIS — M25562 Pain in left knee: Secondary | ICD-10-CM | POA: Diagnosis not present

## 2020-01-13 DIAGNOSIS — M1712 Unilateral primary osteoarthritis, left knee: Secondary | ICD-10-CM | POA: Diagnosis not present

## 2020-01-23 DIAGNOSIS — Z1211 Encounter for screening for malignant neoplasm of colon: Secondary | ICD-10-CM | POA: Diagnosis not present

## 2020-01-31 LAB — COLOGUARD: COLOGUARD: NEGATIVE

## 2020-07-03 DIAGNOSIS — E039 Hypothyroidism, unspecified: Secondary | ICD-10-CM | POA: Diagnosis not present

## 2020-07-10 DIAGNOSIS — E042 Nontoxic multinodular goiter: Secondary | ICD-10-CM | POA: Diagnosis not present

## 2020-07-10 DIAGNOSIS — E7800 Pure hypercholesterolemia, unspecified: Secondary | ICD-10-CM | POA: Diagnosis not present

## 2020-07-10 DIAGNOSIS — R232 Flushing: Secondary | ICD-10-CM | POA: Diagnosis not present

## 2020-07-10 DIAGNOSIS — E039 Hypothyroidism, unspecified: Secondary | ICD-10-CM | POA: Diagnosis not present

## 2020-07-12 DIAGNOSIS — H26493 Other secondary cataract, bilateral: Secondary | ICD-10-CM | POA: Diagnosis not present

## 2020-08-14 DIAGNOSIS — U071 COVID-19: Secondary | ICD-10-CM | POA: Diagnosis not present

## 2021-01-04 DIAGNOSIS — E039 Hypothyroidism, unspecified: Secondary | ICD-10-CM | POA: Diagnosis not present

## 2021-01-04 DIAGNOSIS — E7800 Pure hypercholesterolemia, unspecified: Secondary | ICD-10-CM | POA: Diagnosis not present

## 2021-01-11 DIAGNOSIS — E042 Nontoxic multinodular goiter: Secondary | ICD-10-CM | POA: Diagnosis not present

## 2021-01-11 DIAGNOSIS — E7800 Pure hypercholesterolemia, unspecified: Secondary | ICD-10-CM | POA: Diagnosis not present

## 2021-01-11 DIAGNOSIS — E039 Hypothyroidism, unspecified: Secondary | ICD-10-CM | POA: Diagnosis not present

## 2021-01-11 DIAGNOSIS — Z0000 Encounter for general adult medical examination without abnormal findings: Secondary | ICD-10-CM | POA: Diagnosis not present

## 2021-01-11 DIAGNOSIS — Z1231 Encounter for screening mammogram for malignant neoplasm of breast: Secondary | ICD-10-CM | POA: Diagnosis not present

## 2021-01-11 DIAGNOSIS — Z0001 Encounter for general adult medical examination with abnormal findings: Secondary | ICD-10-CM | POA: Diagnosis not present

## 2021-01-30 ENCOUNTER — Other Ambulatory Visit: Payer: Self-pay | Admitting: Internal Medicine

## 2021-01-30 DIAGNOSIS — Z1231 Encounter for screening mammogram for malignant neoplasm of breast: Secondary | ICD-10-CM

## 2021-03-15 ENCOUNTER — Other Ambulatory Visit: Payer: Self-pay

## 2021-03-15 ENCOUNTER — Ambulatory Visit
Admission: RE | Admit: 2021-03-15 | Discharge: 2021-03-15 | Disposition: A | Discharge status: Home / Self Care | Payer: PPO | Source: Ambulatory Visit | Attending: Internal Medicine | Admitting: Internal Medicine

## 2021-03-15 DIAGNOSIS — Z1231 Encounter for screening mammogram for malignant neoplasm of breast: Secondary | ICD-10-CM | POA: Insufficient documentation

## 2021-07-05 DIAGNOSIS — E039 Hypothyroidism, unspecified: Secondary | ICD-10-CM | POA: Diagnosis not present

## 2021-07-12 DIAGNOSIS — E039 Hypothyroidism, unspecified: Secondary | ICD-10-CM | POA: Diagnosis not present

## 2021-07-12 DIAGNOSIS — Z0000 Encounter for general adult medical examination without abnormal findings: Secondary | ICD-10-CM | POA: Diagnosis not present

## 2021-07-12 DIAGNOSIS — E7800 Pure hypercholesterolemia, unspecified: Secondary | ICD-10-CM | POA: Diagnosis not present

## 2022-01-06 DIAGNOSIS — E039 Hypothyroidism, unspecified: Secondary | ICD-10-CM | POA: Diagnosis not present

## 2022-01-06 DIAGNOSIS — E7800 Pure hypercholesterolemia, unspecified: Secondary | ICD-10-CM | POA: Diagnosis not present

## 2022-01-16 DIAGNOSIS — E039 Hypothyroidism, unspecified: Secondary | ICD-10-CM | POA: Diagnosis not present

## 2022-01-16 DIAGNOSIS — Z0000 Encounter for general adult medical examination without abnormal findings: Secondary | ICD-10-CM | POA: Diagnosis not present

## 2022-01-16 DIAGNOSIS — E7800 Pure hypercholesterolemia, unspecified: Secondary | ICD-10-CM | POA: Diagnosis not present

## 2022-01-16 DIAGNOSIS — Z0001 Encounter for general adult medical examination with abnormal findings: Secondary | ICD-10-CM | POA: Diagnosis not present

## 2022-07-15 DIAGNOSIS — E039 Hypothyroidism, unspecified: Secondary | ICD-10-CM | POA: Diagnosis not present

## 2022-07-22 DIAGNOSIS — Z0000 Encounter for general adult medical examination without abnormal findings: Secondary | ICD-10-CM | POA: Diagnosis not present

## 2022-07-22 DIAGNOSIS — E7800 Pure hypercholesterolemia, unspecified: Secondary | ICD-10-CM | POA: Diagnosis not present

## 2022-07-22 DIAGNOSIS — E039 Hypothyroidism, unspecified: Secondary | ICD-10-CM | POA: Diagnosis not present

## 2022-09-18 ENCOUNTER — Other Ambulatory Visit: Payer: Self-pay | Admitting: Internal Medicine

## 2022-09-18 DIAGNOSIS — Z1231 Encounter for screening mammogram for malignant neoplasm of breast: Secondary | ICD-10-CM

## 2022-09-30 ENCOUNTER — Ambulatory Visit
Admission: RE | Admit: 2022-09-30 | Discharge: 2022-09-30 | Disposition: A | Discharge status: Home / Self Care | Payer: PPO | Source: Ambulatory Visit | Attending: Internal Medicine | Admitting: Internal Medicine

## 2022-09-30 DIAGNOSIS — Z1231 Encounter for screening mammogram for malignant neoplasm of breast: Secondary | ICD-10-CM | POA: Diagnosis not present

## 2022-12-04 IMAGING — MG MM DIGITAL SCREENING BILAT W/ TOMO AND CAD
8 series · 8 of 24 positions shown · non-contrast
Comparison: Previous exam(s).

CLINICAL DATA: Screening.

EXAM:
DIGITAL SCREENING BILATERAL MAMMOGRAM WITH TOMOSYNTHESIS AND CAD
TECHNIQUE: Bilateral screening digital craniocaudal and mediolateral oblique
mammograms were obtained. Bilateral screening digital breast
tomosynthesis was performed. The images were evaluated with
computer-aided detection.

[L CC synth-2D]
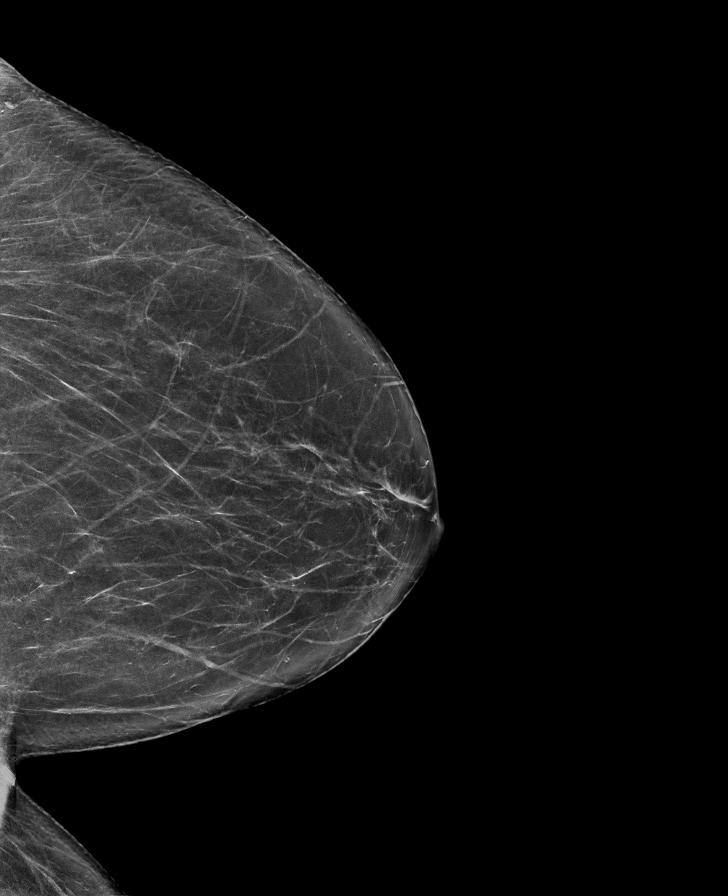

[L MLO synth-2D]
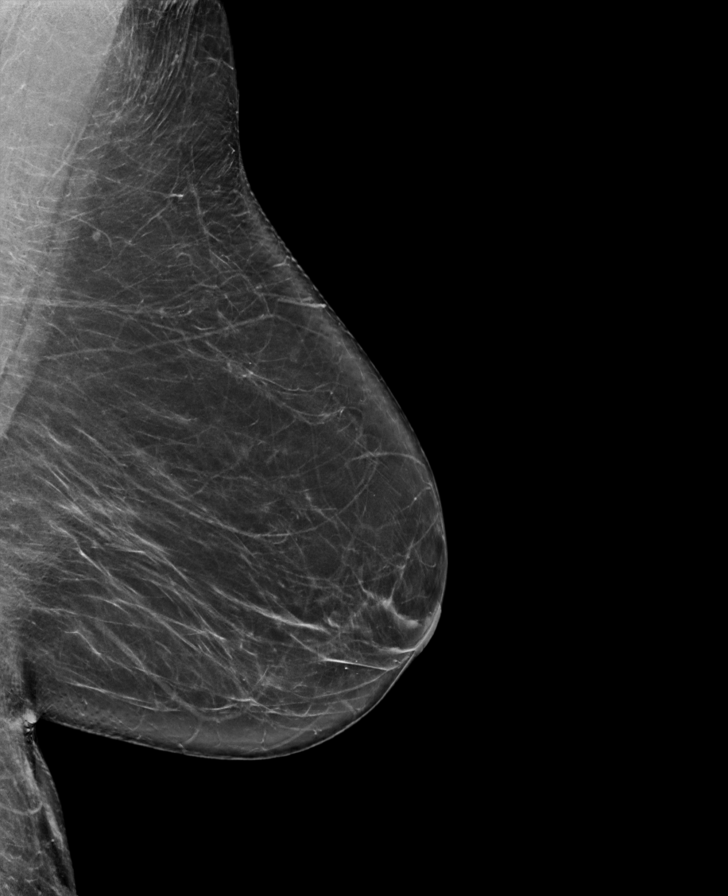

[R CC synth-2D]
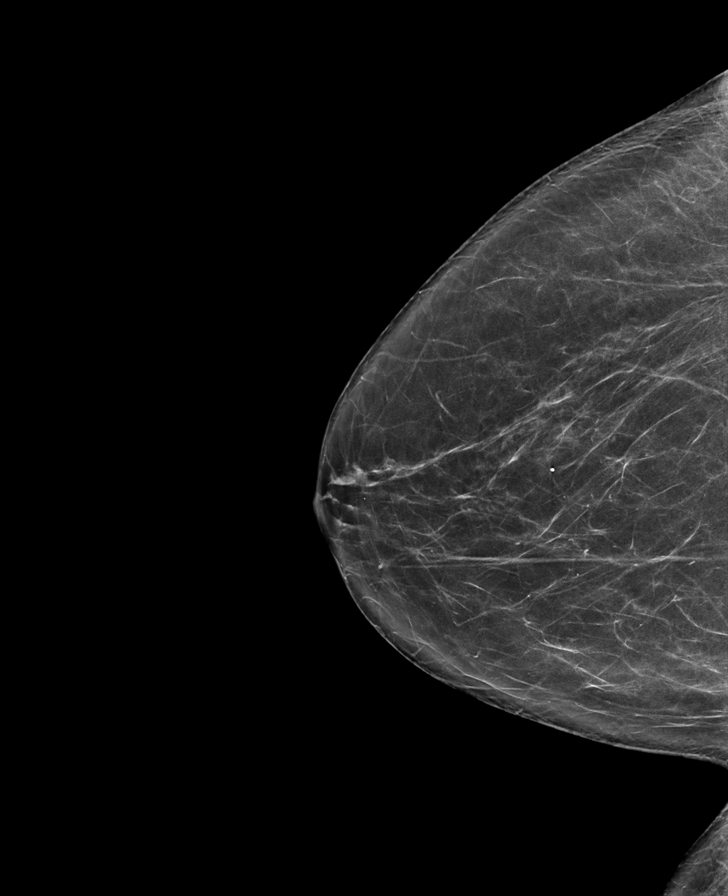

[R MLO synth-2D]
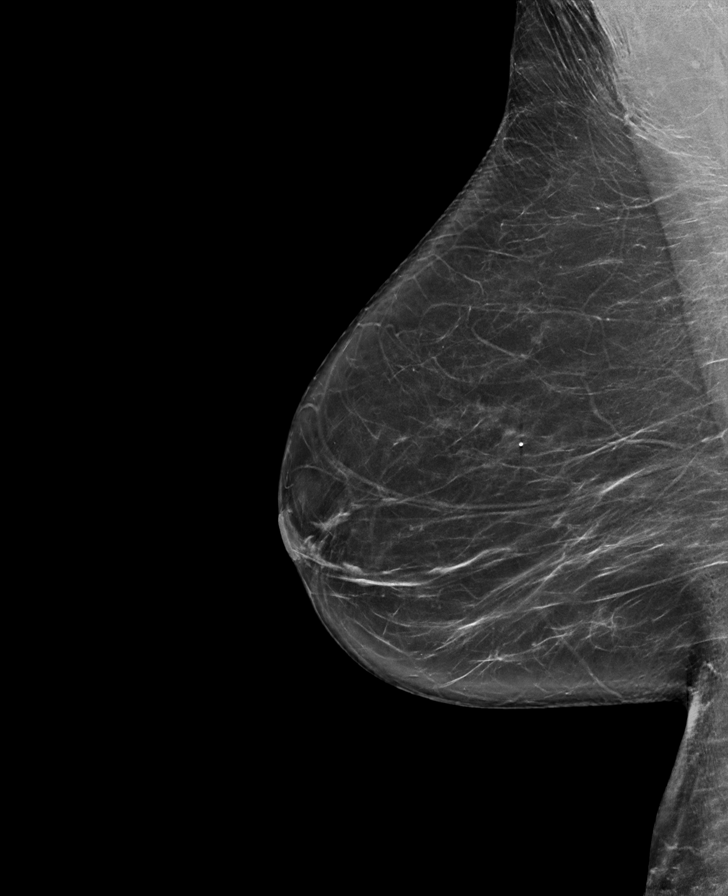

[L CC tomo · tomo slice 35/68.0]
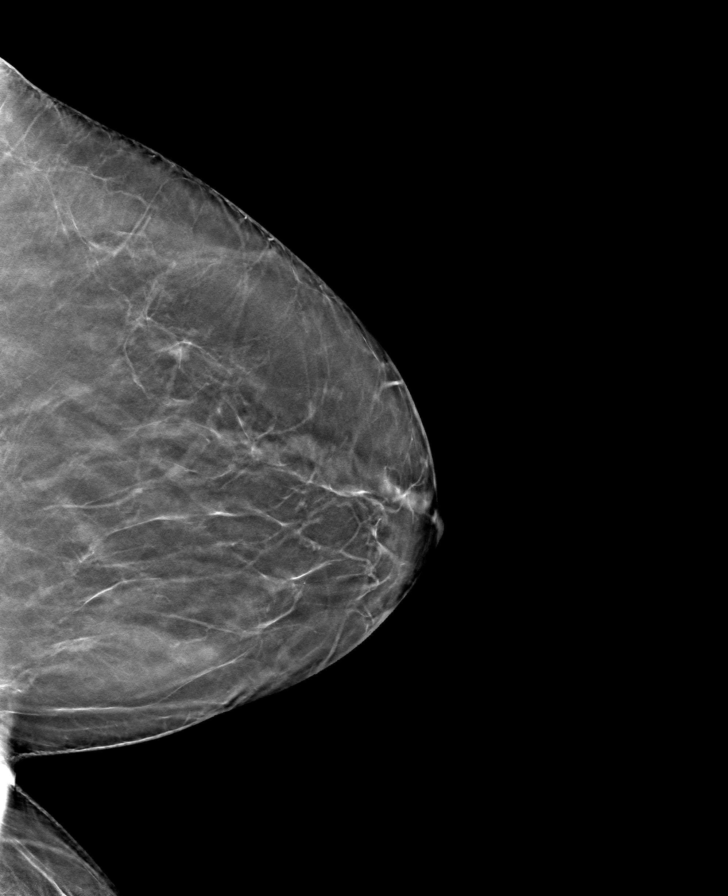

[L MLO tomo · tomo slice 39/76.0]
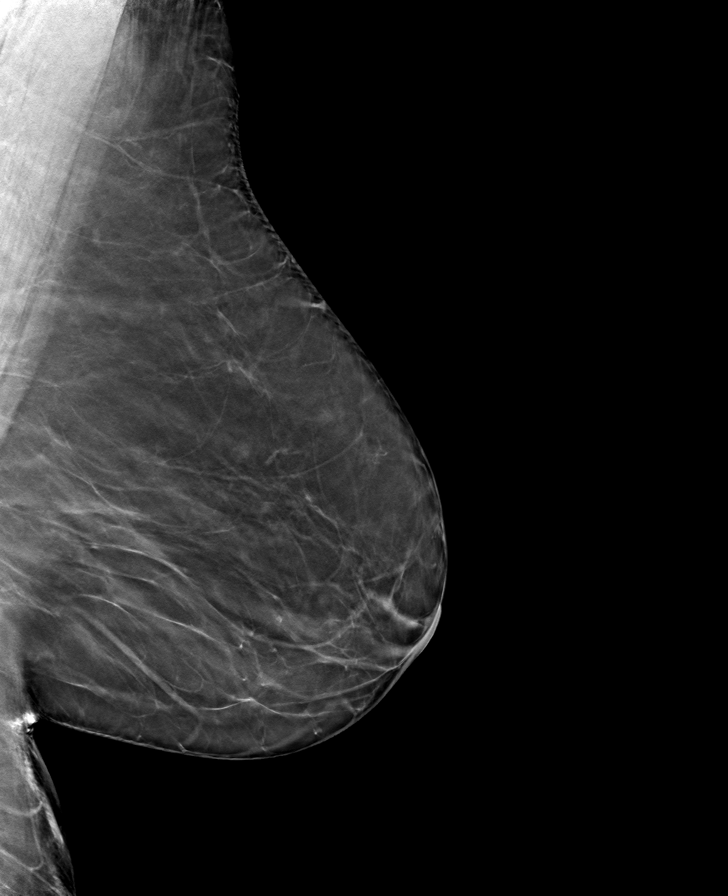

[R CC tomo · tomo slice 36/71.0]
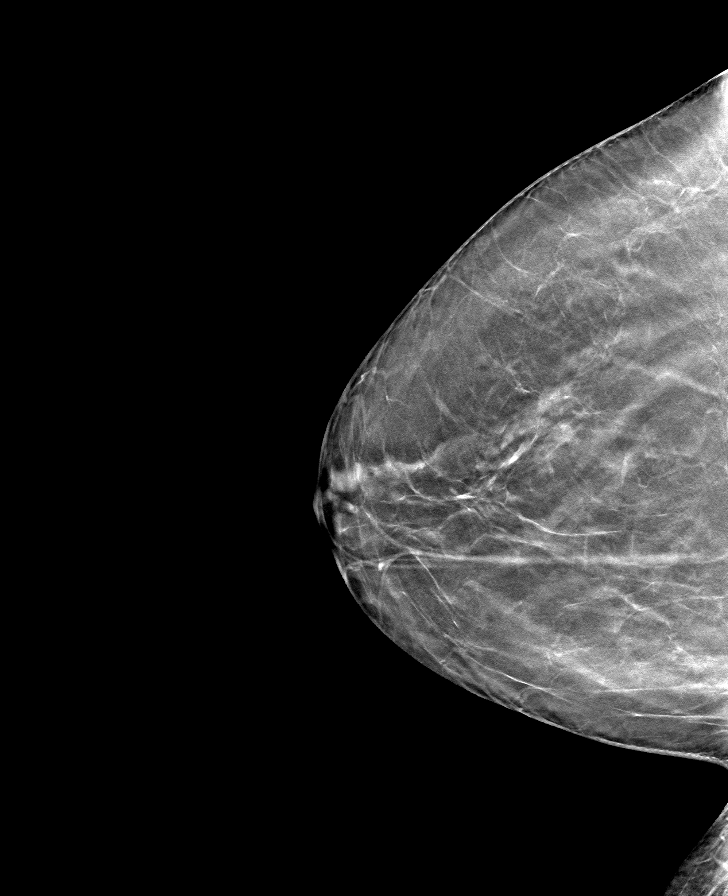

[R MLO tomo · tomo slice 39/77.0]
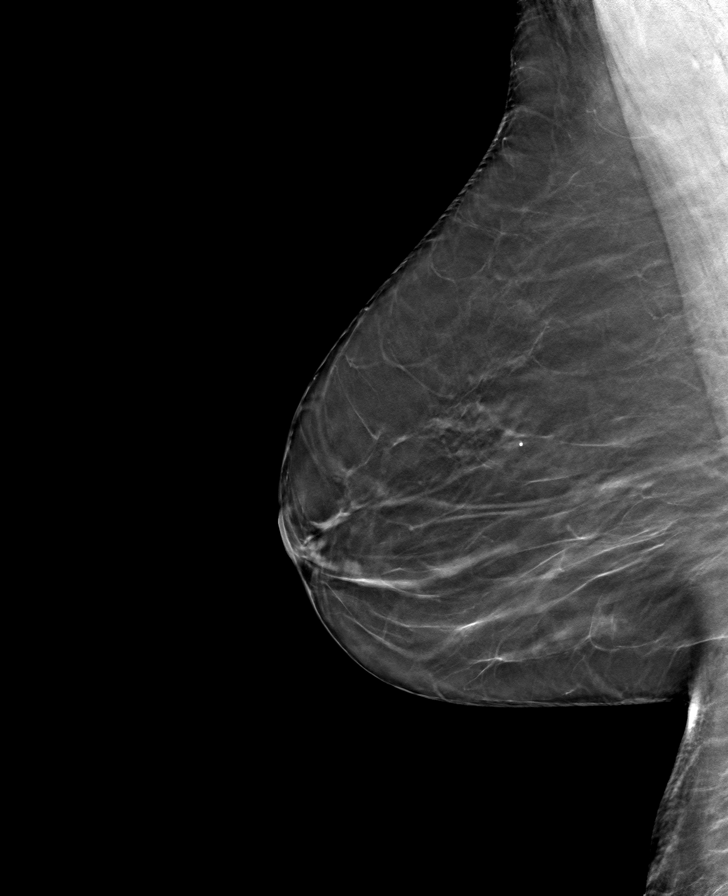

[8 of 24 positions shown; findings below may reference images not displayed]

ACR Breast Density Category b: There are scattered areas of
fibroglandular density.
FINDINGS: There are no findings suspicious for malignancy.
IMPRESSION: No mammographic evidence of malignancy. A result letter of this
screening mammogram will be mailed directly to the patient.

RECOMMENDATION:
Screening mammogram in one year. (Code:51-O-LD2)

BI-RADS CATEGORY  1: Negative.

## 2023-01-13 ENCOUNTER — Ambulatory Visit: Payer: Self-pay | Admitting: Internal Medicine

## 2023-01-14 DIAGNOSIS — E039 Hypothyroidism, unspecified: Secondary | ICD-10-CM | POA: Diagnosis not present

## 2023-01-21 DIAGNOSIS — E7800 Pure hypercholesterolemia, unspecified: Secondary | ICD-10-CM | POA: Diagnosis not present

## 2023-01-21 DIAGNOSIS — Z0001 Encounter for general adult medical examination with abnormal findings: Secondary | ICD-10-CM | POA: Diagnosis not present

## 2023-01-21 DIAGNOSIS — E039 Hypothyroidism, unspecified: Secondary | ICD-10-CM | POA: Diagnosis not present

## 2023-04-21 DIAGNOSIS — H5203 Hypermetropia, bilateral: Secondary | ICD-10-CM | POA: Diagnosis not present

## 2023-04-21 DIAGNOSIS — H26493 Other secondary cataract, bilateral: Secondary | ICD-10-CM | POA: Diagnosis not present

## 2023-07-16 DIAGNOSIS — E039 Hypothyroidism, unspecified: Secondary | ICD-10-CM | POA: Diagnosis not present

## 2023-07-22 DIAGNOSIS — Z0000 Encounter for general adult medical examination without abnormal findings: Secondary | ICD-10-CM | POA: Diagnosis not present

## 2023-07-22 DIAGNOSIS — E039 Hypothyroidism, unspecified: Secondary | ICD-10-CM | POA: Diagnosis not present

## 2023-07-22 DIAGNOSIS — Z1331 Encounter for screening for depression: Secondary | ICD-10-CM | POA: Diagnosis not present

## 2023-07-22 DIAGNOSIS — E7800 Pure hypercholesterolemia, unspecified: Secondary | ICD-10-CM | POA: Diagnosis not present

## 2023-08-31 ENCOUNTER — Other Ambulatory Visit: Payer: Self-pay | Admitting: Internal Medicine

## 2023-08-31 DIAGNOSIS — Z1231 Encounter for screening mammogram for malignant neoplasm of breast: Secondary | ICD-10-CM

## 2023-10-02 ENCOUNTER — Ambulatory Visit
Admission: RE | Admit: 2023-10-02 | Discharge: 2023-10-02 | Disposition: A | Discharge status: Home / Self Care | Source: Ambulatory Visit | Attending: Internal Medicine | Admitting: Internal Medicine

## 2023-10-02 DIAGNOSIS — Z1231 Encounter for screening mammogram for malignant neoplasm of breast: Secondary | ICD-10-CM | POA: Diagnosis not present
